# Patient Record
Sex: Female | Born: 1935 | Race: White | Hispanic: No | Marital: Married | Smoking: Never smoker
Health system: Southern US, Community
[De-identification: ages and names within clinical notes are randomized; demographics above are authoritative.]

## PROBLEM LIST (undated history)

## (undated) DIAGNOSIS — L570 Actinic keratosis: Secondary | ICD-10-CM

## (undated) HISTORY — DX: Actinic keratosis: L57.0

---

## 2018-10-15 ENCOUNTER — Other Ambulatory Visit: Payer: Self-pay | Admitting: Family Medicine

## 2018-10-15 DIAGNOSIS — Z1231 Encounter for screening mammogram for malignant neoplasm of breast: Secondary | ICD-10-CM

## 2019-03-04 ENCOUNTER — Encounter: Payer: Self-pay | Admitting: Radiology

## 2019-03-04 ENCOUNTER — Ambulatory Visit
Admission: RE | Admit: 2019-03-04 | Discharge: 2019-03-04 | Disposition: A | Discharge status: Home / Self Care | Payer: Medicare Other | Source: Ambulatory Visit | Attending: Family Medicine | Admitting: Family Medicine

## 2019-03-04 DIAGNOSIS — Z1231 Encounter for screening mammogram for malignant neoplasm of breast: Secondary | ICD-10-CM | POA: Insufficient documentation

## 2020-10-28 ENCOUNTER — Other Ambulatory Visit: Payer: Self-pay | Admitting: Family Medicine

## 2020-10-28 DIAGNOSIS — Z1231 Encounter for screening mammogram for malignant neoplasm of breast: Secondary | ICD-10-CM

## 2020-11-06 ENCOUNTER — Other Ambulatory Visit: Payer: Self-pay

## 2020-11-06 ENCOUNTER — Ambulatory Visit
Admission: RE | Admit: 2020-11-06 | Discharge: 2020-11-06 | Disposition: A | Discharge status: Home / Self Care | Payer: Medicare Other | Source: Ambulatory Visit | Attending: Family Medicine | Admitting: Family Medicine

## 2020-11-06 DIAGNOSIS — Z1231 Encounter for screening mammogram for malignant neoplasm of breast: Secondary | ICD-10-CM | POA: Insufficient documentation

## 2021-04-04 ENCOUNTER — Encounter: Payer: Self-pay | Admitting: Emergency Medicine

## 2021-04-04 ENCOUNTER — Other Ambulatory Visit: Payer: Self-pay

## 2021-04-04 ENCOUNTER — Ambulatory Visit
Admission: EM | Admit: 2021-04-04 | Discharge: 2021-04-04 | Disposition: A | Discharge status: Home / Self Care | Payer: Medicare Other | Attending: Emergency Medicine | Admitting: Emergency Medicine

## 2021-04-04 ENCOUNTER — Ambulatory Visit (INDEPENDENT_AMBULATORY_CARE_PROVIDER_SITE_OTHER): Payer: Medicare Other

## 2021-04-04 DIAGNOSIS — U071 COVID-19: Secondary | ICD-10-CM | POA: Diagnosis not present

## 2021-04-04 DIAGNOSIS — R030 Elevated blood-pressure reading, without diagnosis of hypertension: Secondary | ICD-10-CM | POA: Diagnosis not present

## 2021-04-04 DIAGNOSIS — M545 Low back pain, unspecified: Secondary | ICD-10-CM | POA: Diagnosis not present

## 2021-04-04 LAB — POCT URINALYSIS DIP (MANUAL ENTRY)
Bilirubin, UA: NEGATIVE
Blood, UA: NEGATIVE
Glucose, UA: NEGATIVE mg/dL
Nitrite, UA: NEGATIVE
Protein Ur, POC: NEGATIVE mg/dL
Spec Grav, UA: 1.02 (ref 1.010–1.025)
Urobilinogen, UA: 0.2 E.U./dL
pH, UA: 5.5 (ref 5.0–8.0)

## 2021-04-04 NOTE — ED Provider Notes (Signed)
Penny Roman    CSN: [id number] Arrival date & time: 04/04/21  1025      History   Chief Complaint Chief Complaint  Patient presents with   Back Pain    HPI Carlota Merwin is a 86 y.o. female.  Patient presents with with right lumbar back pain since yesterday.  She woke up from a nap with the pain.  Worse with position changes and deep breaths.  She had a fall on 03/23/2021 but her low back pain resolved after a couple of days.  No falls or injury yesterday.  She tested positive for COVID on 04/01/2021; symptom onset 03/30/2021 with runny nose and scratchy throat; these symptoms have resolved.  She denies numbness, weakness, saddle anesthesia, new loss of bowel/bladder control, fever, rash, chest pain, shortness of breath, abdominal pain, dysuria, hematuria, or other symptoms.  Her medical history includes lumbar spondylosis, bursitis of the left hip, scoliosis of lumbar spine; she was seen by ortho in October 2022.   The history is provided by the patient and medical records.   History reviewed. No pertinent past medical history.  There are no problems to display for this patient.   History reviewed. No pertinent surgical history.  OB History   No obstetric history on file.      Home Medications    Prior to Admission medications   Medication Sig Start Date End Date Taking? Authorizing Provider  Cholecalciferol 50 MCG (2000 UT) TABS Take by mouth.    [provider]  Lutein-Zeaxanthin 25-5 MG CAPS Take by mouth.    [provider]  Multiple Vitamin (MULTI-VITAMIN) tablet Take 1 tablet by mouth daily.    [provider]    Family History Family History  Problem Relation Age of Onset   Breast cancer Neg Hx     Social History Social History   Tobacco Use   Smoking status: Never   Smokeless tobacco: Never  Substance Use Topics   Alcohol use: Not Currently   Drug use: Never     Allergies   Patient has no known  allergies.   Review of Systems Review of Systems  Constitutional:  Negative for chills and fever.  Respiratory:  Negative for cough and shortness of breath.   Cardiovascular:  Negative for chest pain and palpitations.  Gastrointestinal:  Negative for abdominal pain, diarrhea, nausea and vomiting.  Genitourinary:  Negative for dysuria and hematuria.  Musculoskeletal:  Positive for back pain. Negative for gait problem.  Skin:  Negative for color change and rash.  Neurological:  Negative for weakness and numbness.  All other systems reviewed and are negative.   Physical Exam Triage Vital Signs ED Triage Vitals  Enc Vitals Group     BP 04/04/21 1044 (!) 170/91     Pulse Rate 04/04/21 1044 94     Resp 04/04/21 1044 18     Temp 04/04/21 1044 98.2 F (36.8 C)     Temp src --      SpO2 04/04/21 1044 96 %     Weight --      Height --      Head Circumference --      Peak Flow --      Pain Score 04/04/21 1054 5     Pain Loc --      Pain Edu? --      Excl. in [place]? --    No data found.  Updated Vital Signs BP (!) 170/91 (BP Location: Left Arm)  Pulse 94    Temp 98.2 F (36.8 C)    Resp 18    SpO2 96%   Visual Acuity Right Eye Distance:   Left Eye Distance:   Bilateral Distance:    Right Eye Near:   Left Eye Near:    Bilateral Near:     Physical Exam Vitals and nursing note reviewed.  Constitutional:      General: She is not in acute distress.    Appearance: Normal appearance. She is well-developed. She is not ill-appearing.  HENT:     Mouth/Throat:     Mouth: Mucous membranes are moist.  Cardiovascular:     Rate and Rhythm: Normal rate and regular rhythm.     Heart sounds: Normal heart sounds.  Pulmonary:     Effort: Pulmonary effort is normal. No respiratory distress.     Breath sounds: Normal breath sounds.  Abdominal:     General: Bowel sounds are normal.     Palpations: Abdomen is soft.     Tenderness: There is no abdominal tenderness. There is no right CVA  tenderness, left CVA tenderness, guarding or rebound.  Musculoskeletal:        General: Tenderness present. No swelling or deformity. Normal range of motion.       Arms:     Cervical back: Neck supple.  Skin:    General: Skin is warm and dry.     Capillary Refill: Capillary refill takes less than 2 seconds.     Findings: No rash.  Neurological:     General: No focal deficit present.     Mental Status: She is alert and oriented to person, place, and time.     Sensory: No sensory deficit.     Motor: No weakness.  Psychiatric:        Mood and Affect: Mood normal.        Behavior: Behavior normal.     UC Treatments / Results  Labs (all labs ordered are listed, but only abnormal results are displayed) Labs Reviewed  POCT URINALYSIS DIP (MANUAL ENTRY) - Abnormal; Notable for the following components:      Result Value   Ketones, POC UA trace (5) (*)    Leukocytes, UA Trace (*)    All other components within normal limits    EKG   Radiology DG Lumbar Spine Complete  Result Date: 04/04/2021 CLINICAL DATA:  Pain after fall.  COVID-19 positive. EXAM: LUMBAR SPINE - COMPLETE 4+ VIEW COMPARISON:  None. FINDINGS: Scoliotic curvature of the thoracolumbar spine, apex to the right. No other malalignment. No fractures. Multilevel degenerative disc disease and lower lumbar facet degenerative changes. IMPRESSION: No fracture identified.  Degenerative changes.  Scoliosis. Electronically Signed   By: Gerome Sam III M.D.   On: 04/04/2021 11:48    Procedures Procedures (including critical care time)  Medications Ordered in UC Medications - No data to display  Initial Impression / Assessment and Plan / UC Course  I have reviewed the triage vital signs and the nursing notes.  Pertinent labs & imaging results that were available during my care of the patient were reviewed by me and considered in my medical decision making (see chart for details).     Acute right lower back pain,  COVID-19, Elevated blood pressure reading.  Xray shows degenerative changes and scoliosis; no fracture.  Instructed patient to take Tylenol as needed for discomfort.  Discussed quarantine guidelines for COVID.  Instructed patient to follow-up with her PCP or orthopedist if  her back pain is not improving.  Also discussed that her blood pressure is elevated today and needs to be rechecked by her PCP in 2 to 4 weeks.  Patient agrees to plan of care.   Final Clinical Impressions(s) / UC Diagnoses   Final diagnoses:  Acute right-sided low back pain without sciatica  COVID-19  Elevated blood pressure reading     Discharge Instructions      I will call you with the xray results.  Take Tylenol as needed.  Follow up with your primary care provider or orthopedist if your symptoms are not improving.    Your blood pressure is elevated today at 170/91.  Please have this rechecked by your primary care provider in 2-4 weeks.          ED Prescriptions   None    PDMP not reviewed this encounter.   Sharion Balloon, NP 04/04/21 [phone]

## 2021-04-04 NOTE — Discharge Instructions (Addendum)
I will call you with the xray results.  Take Tylenol as needed.  Follow up with your primary care provider or orthopedist if your symptoms are not improving.    Your blood pressure is elevated today at 170/91.  Please have this rechecked by your primary care provider in 2-4 weeks.

## 2021-04-04 NOTE — ED Triage Notes (Signed)
Pt here with right sided upper lumbar pain, almost thoracic since last night. Had fall on 12/6 and felt sore, but ok then, but this pain showed up after a nap last evening. Also COVID positive since Thursday, but those sx are much better. No urinary sx.

## 2022-01-27 ENCOUNTER — Other Ambulatory Visit: Payer: Self-pay | Admitting: Family Medicine

## 2022-01-27 DIAGNOSIS — Z1231 Encounter for screening mammogram for malignant neoplasm of breast: Secondary | ICD-10-CM

## 2022-03-02 ENCOUNTER — Ambulatory Visit
Admission: RE | Admit: 2022-03-02 | Discharge: 2022-03-02 | Disposition: A | Discharge status: Home / Self Care | Payer: Medicare Other | Source: Ambulatory Visit | Attending: Family Medicine | Admitting: Family Medicine

## 2022-03-02 DIAGNOSIS — Z1231 Encounter for screening mammogram for malignant neoplasm of breast: Secondary | ICD-10-CM | POA: Insufficient documentation

## 2023-01-23 DIAGNOSIS — C4492 Squamous cell carcinoma of skin, unspecified: Secondary | ICD-10-CM

## 2023-01-23 HISTORY — DX: Squamous cell carcinoma of skin, unspecified: C44.92

## 2023-03-23 ENCOUNTER — Ambulatory Visit: Payer: Medicare Other | Admitting: Dermatology

## 2023-03-23 ENCOUNTER — Encounter: Payer: Self-pay | Admitting: Dermatology

## 2023-03-23 DIAGNOSIS — L578 Other skin changes due to chronic exposure to nonionizing radiation: Secondary | ICD-10-CM | POA: Diagnosis not present

## 2023-03-23 DIAGNOSIS — Z7189 Other specified counseling: Secondary | ICD-10-CM

## 2023-03-23 DIAGNOSIS — W908XXA Exposure to other nonionizing radiation, initial encounter: Secondary | ICD-10-CM

## 2023-03-23 DIAGNOSIS — C44629 Squamous cell carcinoma of skin of left upper limb, including shoulder: Secondary | ICD-10-CM

## 2023-03-23 DIAGNOSIS — C4492 Squamous cell carcinoma of skin, unspecified: Secondary | ICD-10-CM

## 2023-03-23 NOTE — Progress Notes (Signed)
   New Patient Visit   Subjective  Penny Roman is a 88 y.o. female who presents for the following: SCC L mid forearm bx proven from [facility], pt referred for treatment The patient has spots, moles and lesions to be evaluated, some may be new or changing and the patient may have concern these could be cancer.  New patient referral from [facility].  The following portions of the chart were reviewed this encounter and updated as appropriate: medications, allergies, medical history  Review of Systems:  No other skin or systemic complaints except as noted in HPI or Assessment and Plan.  Objective  Well appearing patient in no apparent distress; mood and affect are within normal limits.   A focused examination was performed of the following areas: L forearm  Relevant exam findings are noted in the Assessment and Plan.  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 - Anterior Skin excision  Total excision diameter (cm):  1.1 Informed consent: discussed and consent obtained   Timeout: patient name, date of birth, surgical site, and procedure verified   Procedure prep:  Patient was prepped and draped in usual sterile fashion Prep type:  Isopropyl alcohol and povidone-iodine Anesthesia: the lesion was anesthetized in a standard fashion   Anesthetic:  1% lidocaine w/ epinephrine 1-100,000 buffered w/ 8.4% NaHCO3 Instrument used: #15 blade   Hemostasis achieved with: pressure   Hemostasis achieved with comment:  Electrocautery Outcome: patient tolerated procedure well with no complications   Post-procedure details: sterile dressing applied and wound care instructions given   Dressing type: bandage and pressure dressing (Mupirocin)    Destruction of lesion Complexity: extensive   Destruction method: electrodesiccation and curettage   Informed consent: discussed and consent obtained   Timeout:   patient name, date of birth, surgical site, and procedure verified Procedure prep:  Patient was prepped and draped in usual sterile fashion Prep type:  Isopropyl alcohol Anesthesia: the lesion was anesthetized in a standard fashion   Anesthetic:  1% lidocaine w/ epinephrine 1-100,000 buffered w/ 8.4% NaHCO3 Curettage performed in three different directions: Yes   Electrodesiccation performed over the curetted area: Yes   Final wound size (cm):  1.1 Hemostasis achieved with:  pressure, aluminum chloride and electrodesiccation Outcome: patient tolerated procedure well with no complications   Post-procedure details: sterile dressing applied and wound care instructions given   Dressing type: bandage, petrolatum and bacitracin   Bx proven (bx by [facility])  Simple excision and EDC today SCC (SQUAMOUS CELL CARCINOMA), ARM, LEFT   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  - Recommend staying in the shade or wearing long sleeves, sun glasses (UVA+UVB protection) and wide brim hats (4-inch brim around the entire circumference of the hat). - Call for new or changing lesions.  Return if symptoms worsen or fail to improve.  I, Ardis Rowan, RMA, am acting as scribe for Armida Sans, MD .   Documentation: I have reviewed the above documentation for accuracy and completeness, and I agree with the above.  Armida Sans, MD

## 2023-03-23 NOTE — Patient Instructions (Addendum)

## 2023-06-12 ENCOUNTER — Other Ambulatory Visit: Payer: Self-pay | Admitting: Family Medicine

## 2023-06-12 DIAGNOSIS — Z1231 Encounter for screening mammogram for malignant neoplasm of breast: Secondary | ICD-10-CM

## 2023-06-21 ENCOUNTER — Ambulatory Visit
Admission: RE | Admit: 2023-06-21 | Discharge: 2023-06-21 | Disposition: A | Discharge status: Home / Self Care | Payer: Medicare Other | Source: Ambulatory Visit | Attending: Family Medicine | Admitting: Family Medicine

## 2023-06-21 DIAGNOSIS — Z1231 Encounter for screening mammogram for malignant neoplasm of breast: Secondary | ICD-10-CM | POA: Diagnosis present

## 2023-06-27 ENCOUNTER — Ambulatory Visit: Payer: Medicare Other | Admitting: Dermatology

## 2023-08-17 ENCOUNTER — Ambulatory Visit: Admitting: Dermatology

## 2023-08-17 DIAGNOSIS — C44629 Squamous cell carcinoma of skin of left upper limb, including shoulder: Secondary | ICD-10-CM | POA: Diagnosis not present

## 2023-08-17 DIAGNOSIS — L821 Other seborrheic keratosis: Secondary | ICD-10-CM | POA: Diagnosis not present

## 2023-08-17 DIAGNOSIS — L814 Other melanin hyperpigmentation: Secondary | ICD-10-CM

## 2023-08-17 DIAGNOSIS — W908XXA Exposure to other nonionizing radiation, initial encounter: Secondary | ICD-10-CM

## 2023-08-17 DIAGNOSIS — Z8589 Personal history of malignant neoplasm of other organs and systems: Secondary | ICD-10-CM

## 2023-08-17 DIAGNOSIS — C4492 Squamous cell carcinoma of skin, unspecified: Secondary | ICD-10-CM

## 2023-08-17 DIAGNOSIS — L578 Other skin changes due to chronic exposure to nonionizing radiation: Secondary | ICD-10-CM | POA: Diagnosis not present

## 2023-08-17 DIAGNOSIS — Z85828 Personal history of other malignant neoplasm of skin: Secondary | ICD-10-CM

## 2023-08-17 NOTE — Progress Notes (Unsigned)
 Follow-Up Visit   Subjective  Penny Roman is a 89 y.o. female who presents for the following: bx proven SCC at left forearm. Patient had bx done at [facility] by [facility].   The patient has spots, moles and lesions to be evaluated, some may be new or changing and the patient may have concern these could be cancer.  The following portions of the chart were reviewed this encounter and updated as appropriate: medications, allergies, medical history  Review of Systems:  No other skin or systemic complaints except as noted in HPI or Assessment and Plan.  Objective  Well appearing patient in no apparent distress; mood and affect are within normal limits.   A focused examination was performed of the following areas: Left forearm  Relevant exam findings are noted in the Assessment and Plan.  Left Forearm Pink biopsy site   Assessment & Plan   Biopsy proven SCC Left Forearm.  See path report in Media. PA who comes to [facility] did biopsy and referred to [facility] for treatment of SCC. 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 Epidermal / dermal shaving  Lesion diameter (cm):  1.2 Informed consent: discussed and consent obtained   Timeout: patient name, date of birth, surgical site, and procedure verified   Procedure prep:  Patient was prepped and draped in usual sterile fashion Prep type:  Isopropyl alcohol Anesthesia: the lesion was anesthetized in a standard fashion   Anesthetic:  1% lidocaine w/ epinephrine 1-100,000 buffered w/ 8.4% NaHCO3 Instrument used: flexible razor blade   Hemostasis achieved with: pressure, aluminum chloride and electrodesiccation   Outcome: patient tolerated procedure well   Post-procedure details: sterile dressing applied and wound care instructions given   Dressing type: bandage and petrolatum    Destruction of lesion Complexity: extensive   Destruction method: electrodesiccation and curettage   Informed consent:  discussed and consent obtained   Timeout:  patient name, date of birth, surgical site, and procedure verified Procedure prep:  Patient was prepped and draped in usual sterile fashion Prep type:  Isopropyl alcohol Anesthesia: the lesion was anesthetized in a standard fashion   Anesthetic:  1% lidocaine w/ epinephrine 1-100,000 buffered w/ 8.4% NaHCO3 Curettage performed in three different directions: Yes   Electrodesiccation performed over the curetted area: Yes   Lesion length (cm):  1.2 Lesion width (cm):  1.2 Margin per side (cm):  0.2 Final wound size (cm):  1.6 Hemostasis achieved with:  pressure, aluminum chloride and electrodesiccation Outcome: patient tolerated procedure well with no complications   Post-procedure details: sterile dressing applied and wound care instructions given   Dressing type: bandage and petrolatum   Patient confirmed biopsy site   LENTIGO   ACTINIC SKIN DAMAGE   HISTORY OF SQUAMOUS CELL CARCINOMA   SEBORRHEIC KERATOSIS    ACTINIC DAMAGE - chronic, secondary to cumulative UV radiation exposure/sun exposure over time - diffuse scaly erythematous macules with underlying dyspigmentation - Recommend daily broad spectrum sunscreen SPF 30+ to sun-exposed areas, reapply every 2 hours as needed.  - Recommend staying in the shade or wearing long sleeves, sun glasses (UVA+UVB protection) and wide brim hats (4-inch brim around the entire circumference of the hat). - Call for new or changing lesions.  LENTIGINES Exam: scattered tan macules Due to sun exposure Treatment Plan: Benign-appearing, observe. Recommend daily broad spectrum sunscreen SPF 30+ to sun-exposed areas, reapply every 2 hours as needed.  Call for any changes  SEBORRHEIC KERATOSIS - Stuck-on, waxy, tan-brown  papules and/or plaques  - Benign-appearing - Discussed benign etiology and prognosis. - Observe - Call for any changes  HISTORY OF SQUAMOUS CELL CARCINOMA OF THE SKIN - L mid  forearm treated 13/2024 - No evidence of recurrence today - No lymphadenopathy - Recommend regular full body skin exams - Recommend daily broad spectrum sunscreen SPF 30+ to sun-exposed areas, reapply every 2 hours as needed.  - Call if any new or changing lesions are noted between office visits    Return if symptoms worsen or fail to improve. Pt prefers to follow up with PA that comes to [facility].  Kerstin Peeling, RMA, am acting as scribe for Celine Collard, MD .   Documentation: I have reviewed the above documentation for accuracy and completeness, and I agree with the above.  Celine Collard, MD

## 2023-08-17 NOTE — Patient Instructions (Signed)

## 2023-08-21 ENCOUNTER — Encounter: Payer: Self-pay | Admitting: Dermatology

## 2023-08-21 ENCOUNTER — Telehealth: Payer: Self-pay

## 2023-08-21 NOTE — Telephone Encounter (Signed)
 Copy of patients pathology to the Left Mid Lateral Forearm laid in your desk for review. aw

## 2023-11-24 IMAGING — DX DG LUMBAR SPINE COMPLETE 4+V
5 series · 5 of 5 positions shown · non-contrast
Comparison: None.

CLINICAL DATA: Pain after fall.  [id number] positive.

EXAM:
LUMBAR SPINE - COMPLETE 4+ VIEW

[lumbar spine ap]
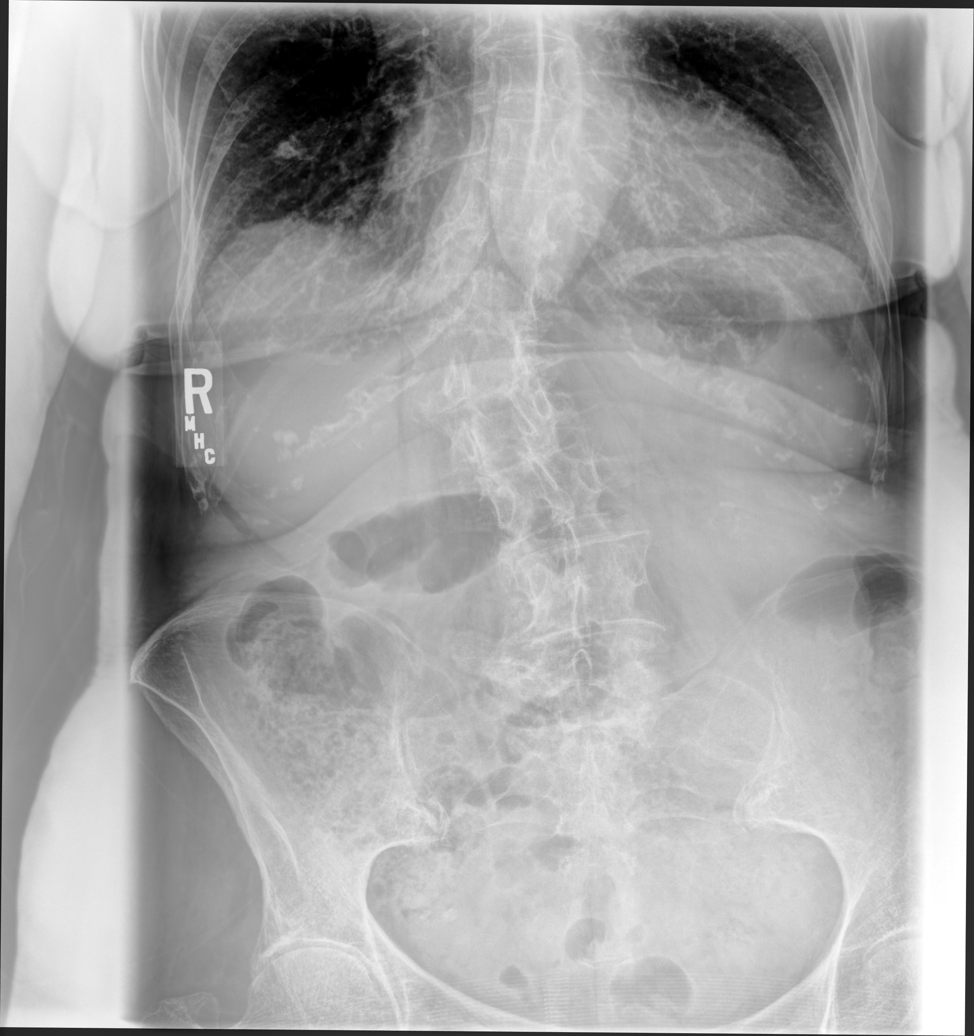

[lumbar spine lmo]
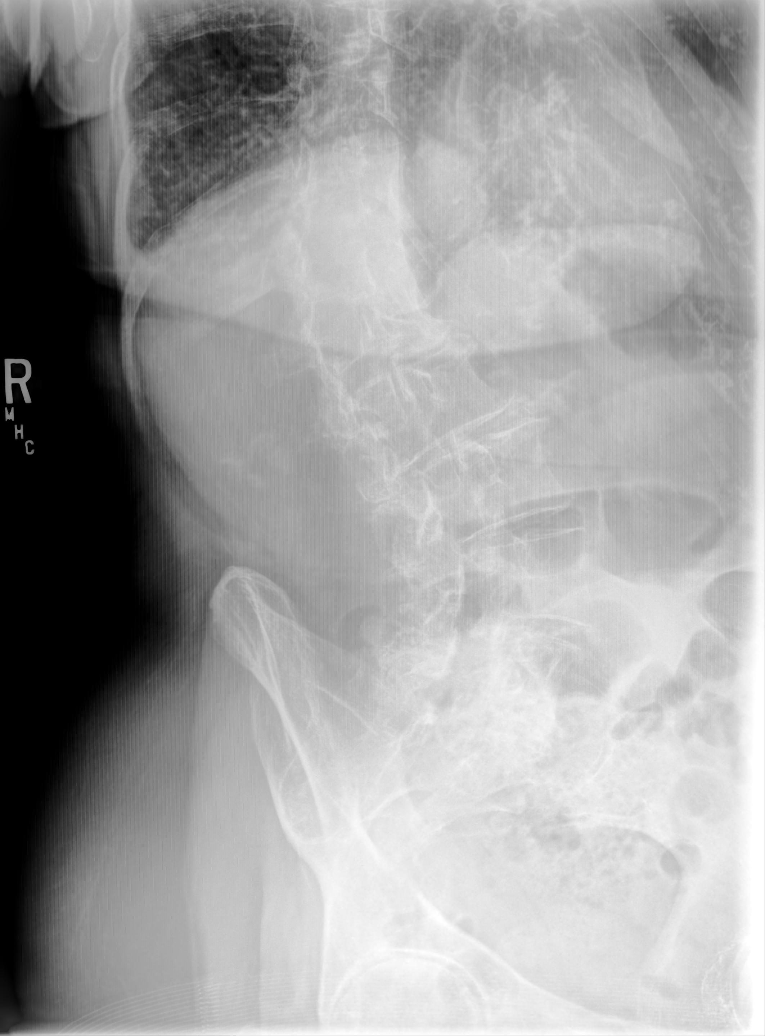

[lumbar spine mlo]
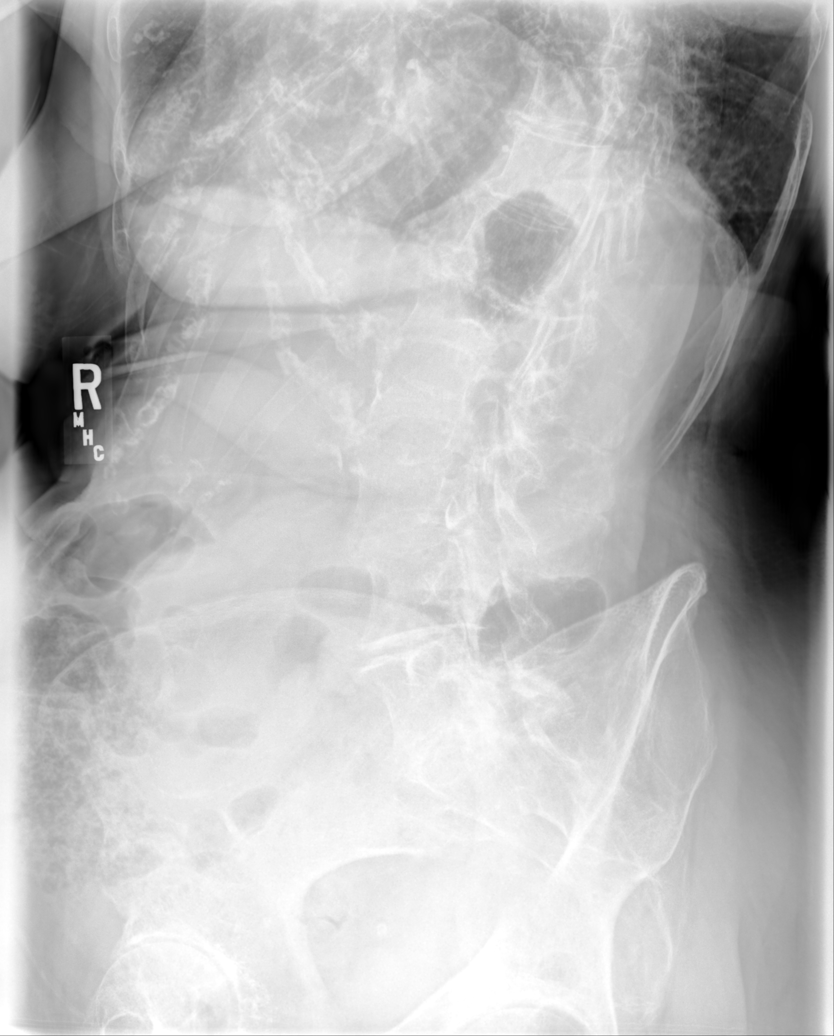

[lumbar spine lat (1 of 2)]
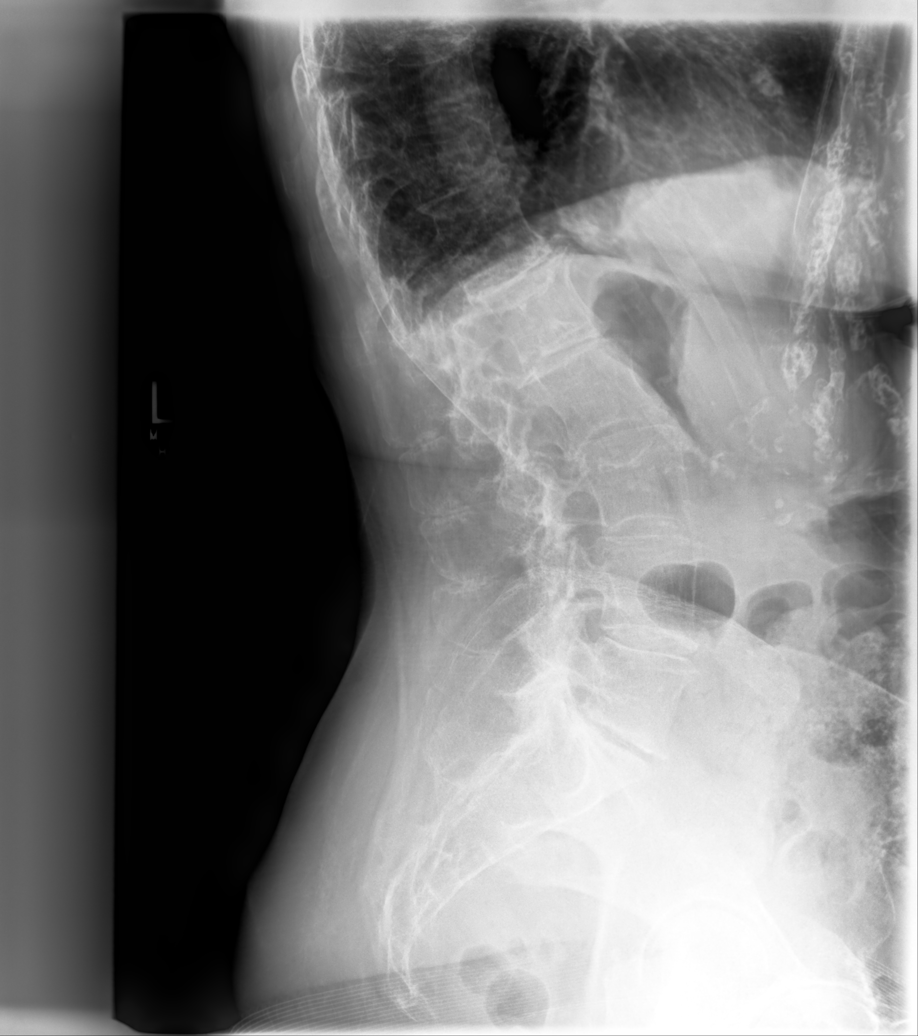

[lumbar spine lat (2 of 2)]
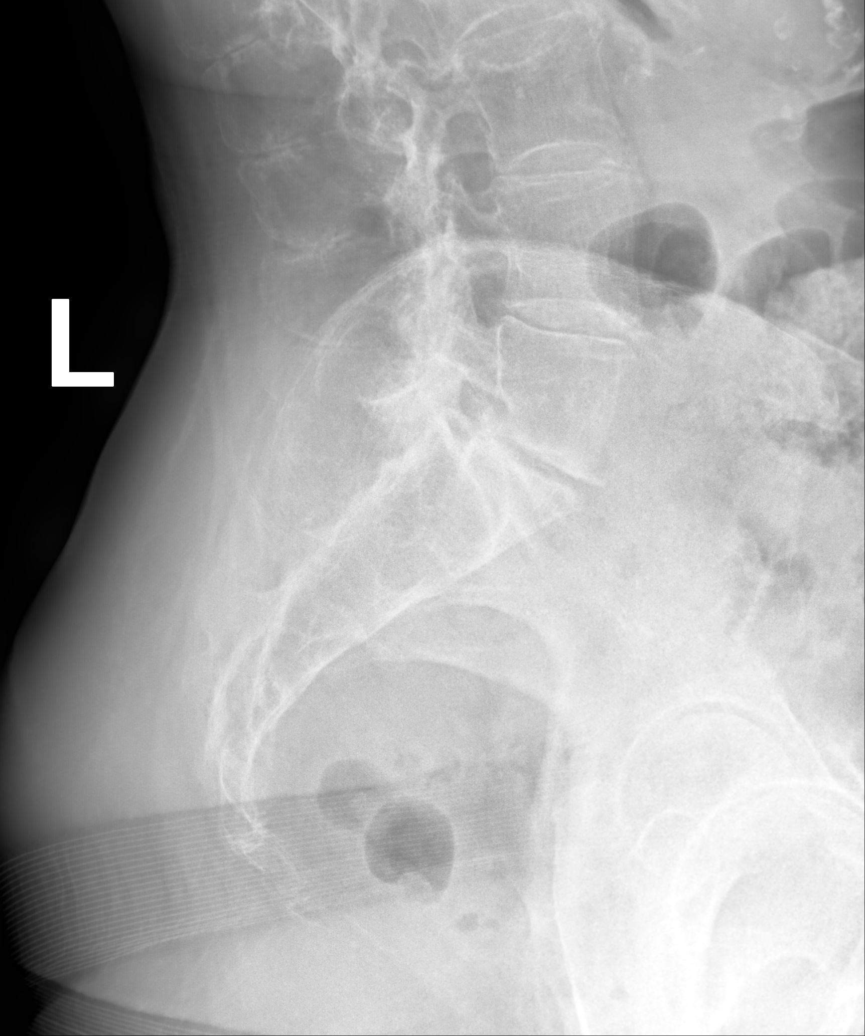

[5 of 5 positions shown; findings below may reference images not displayed]

FINDINGS: Scoliotic curvature of the thoracolumbar spine, apex to the right.
No other malalignment. No fractures. Multilevel degenerative disc
disease and lower lumbar facet degenerative changes.
IMPRESSION: No fracture identified.  Degenerative changes.  Scoliosis.
